# Patient Record
Sex: Female | Born: 2000 | Race: White | Hispanic: No | Marital: Single | Smoking: Current every day smoker
Health system: Southern US, Community
[De-identification: ages and names within clinical notes are randomized; demographics above are authoritative.]

---
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
## 2019-03-10 IMAGING — US US ABDOMEN COMPLETE
1 series · 14 of 25 positions shown · non-contrast
Comparison: None.

CLINICAL DATA: Chronic left upper quadrant pain.

EXAM:
ABDOMEN ULTRASOUND COMPLETE

[Series 1: us abdomen complete · 14 of 114 slices shown]
[im 1/114]
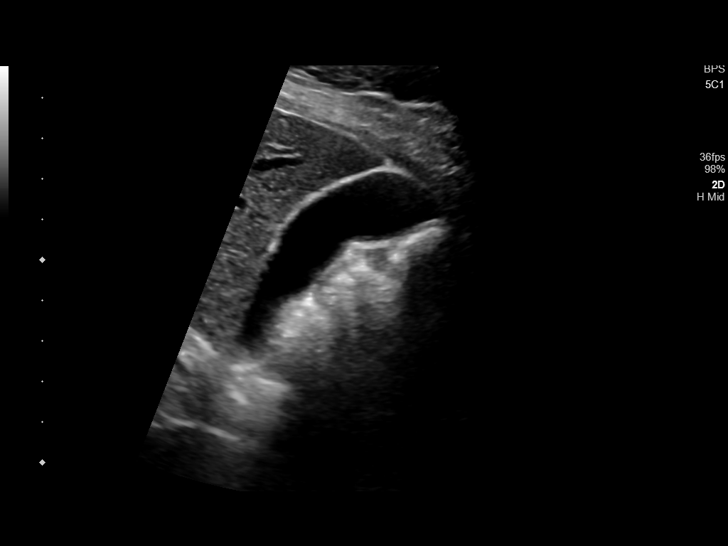
[im 10/114]
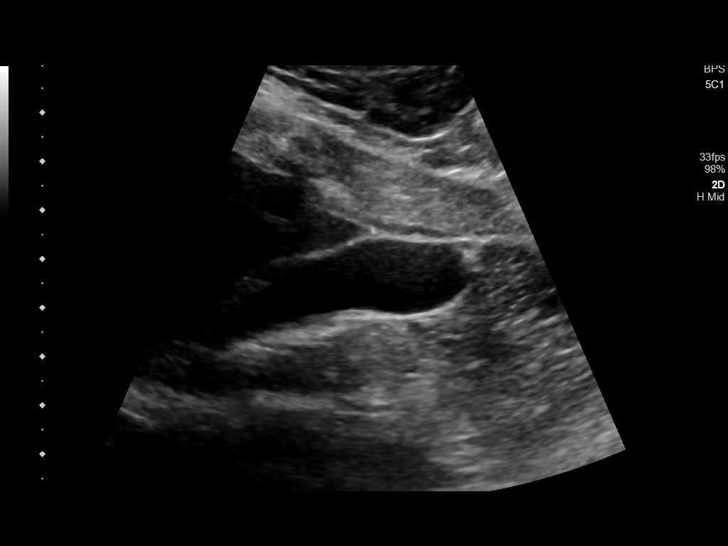
[im 19/114]
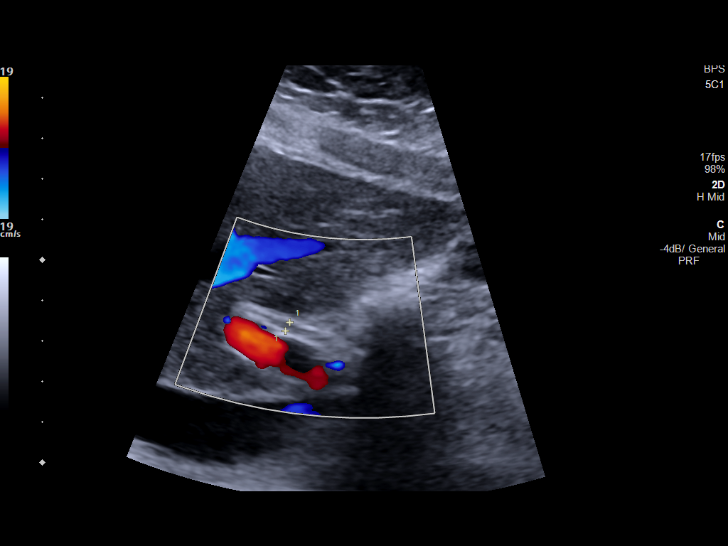
[im 29/114]
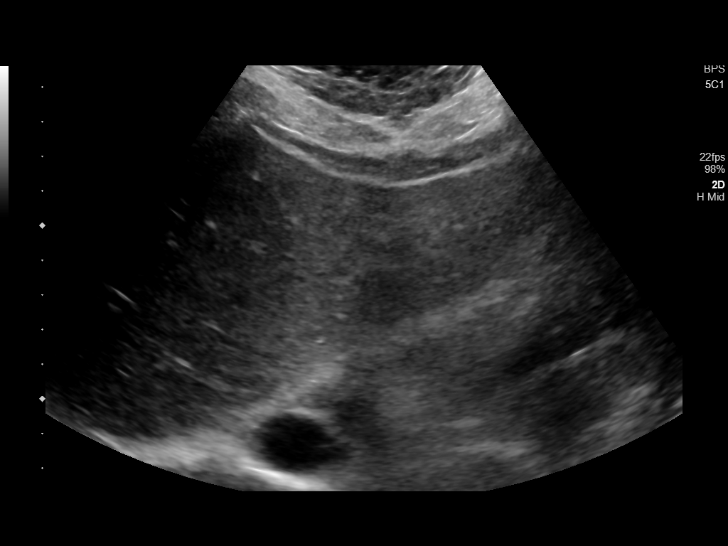
[im 38/114]
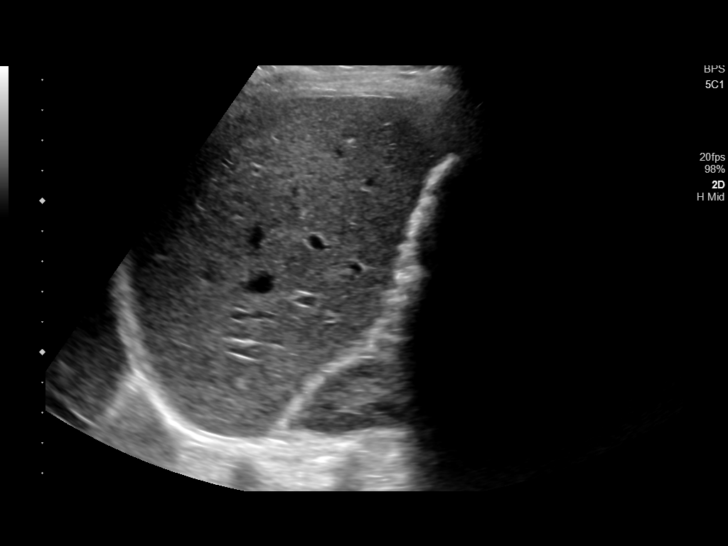
[im 43/114]
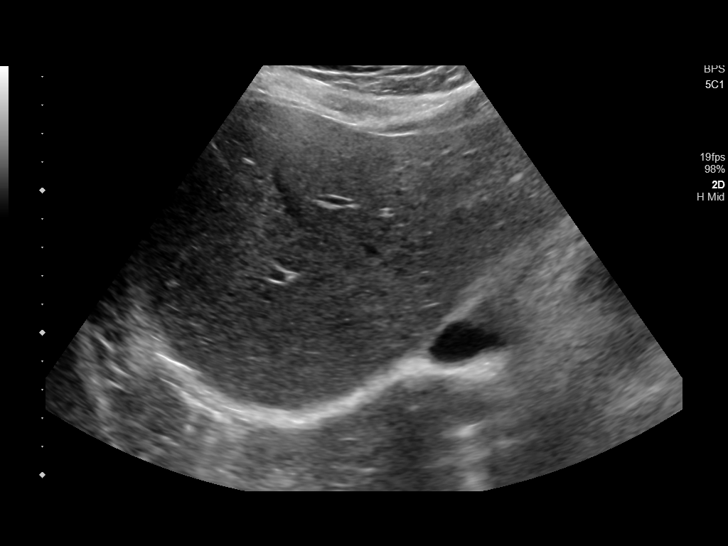
[im 52/114]
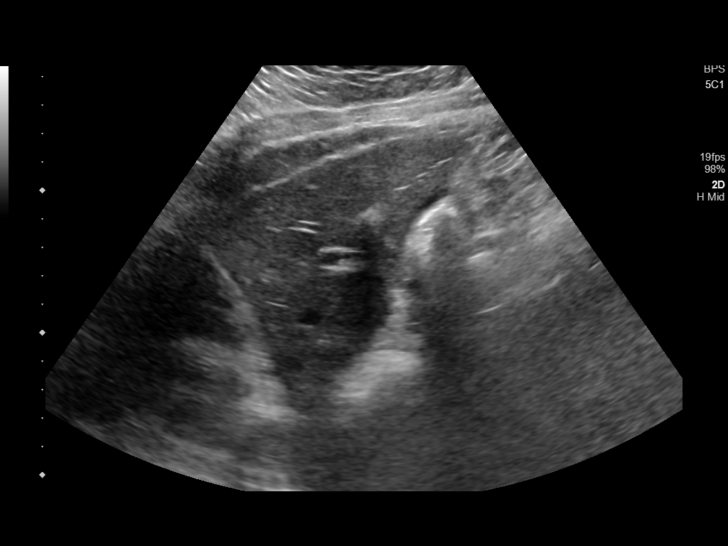
[im 62/114]
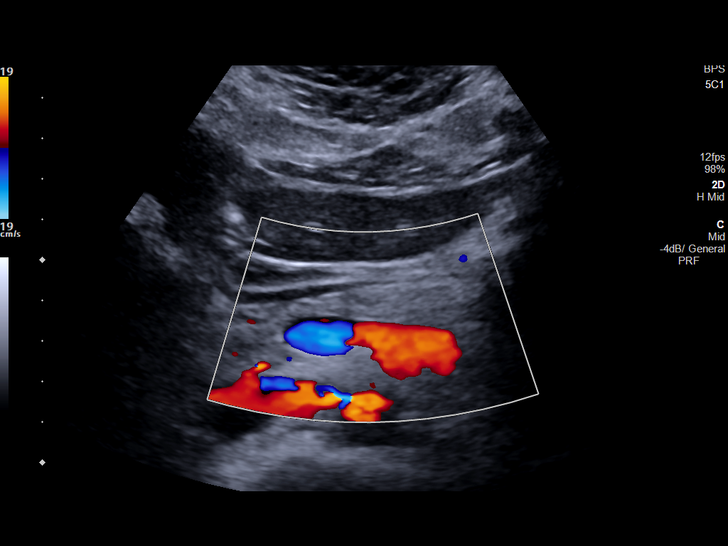
[im 71/114]
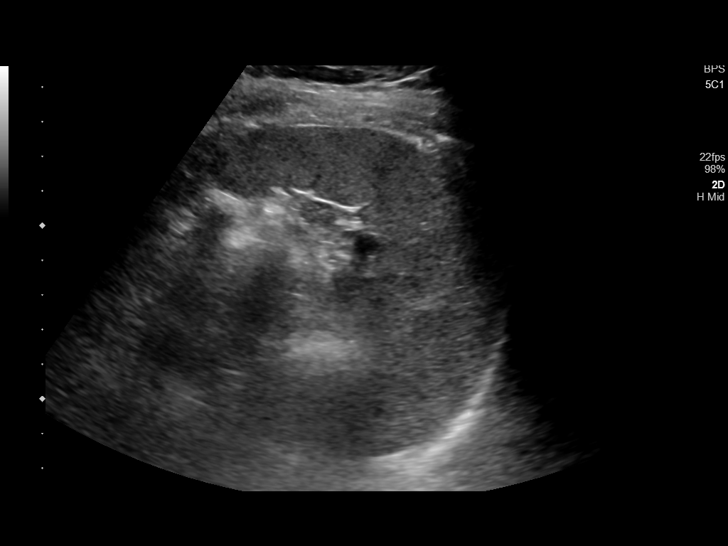
[im 76/114]
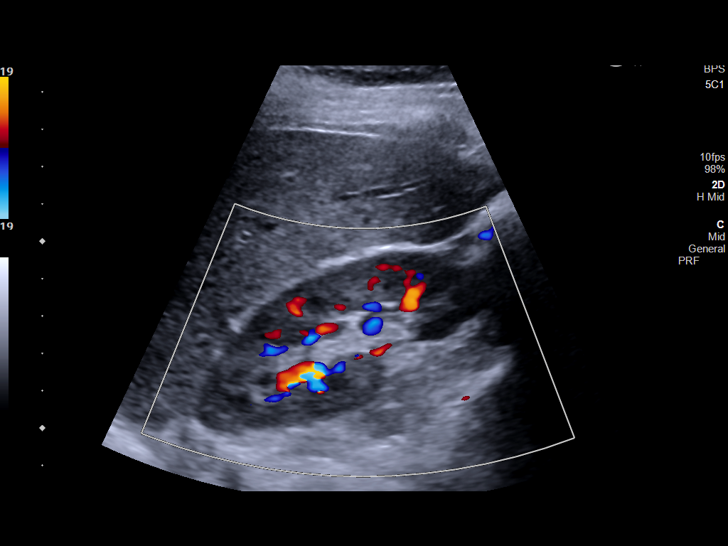
[im 85/114]
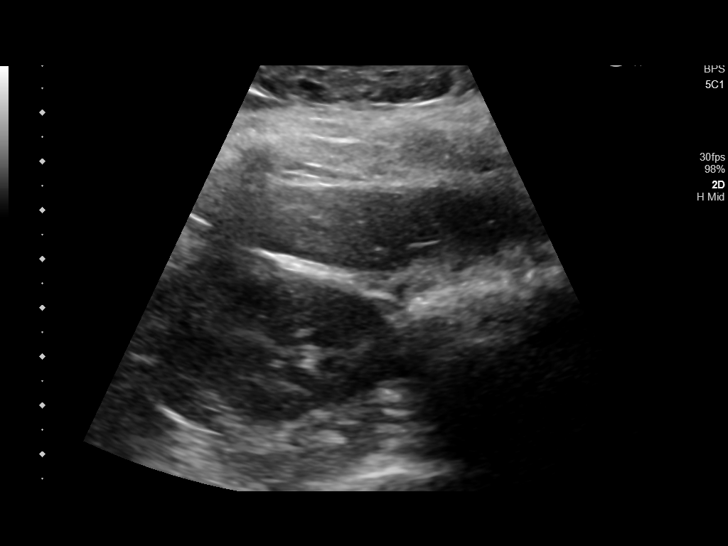
[im 95/114]
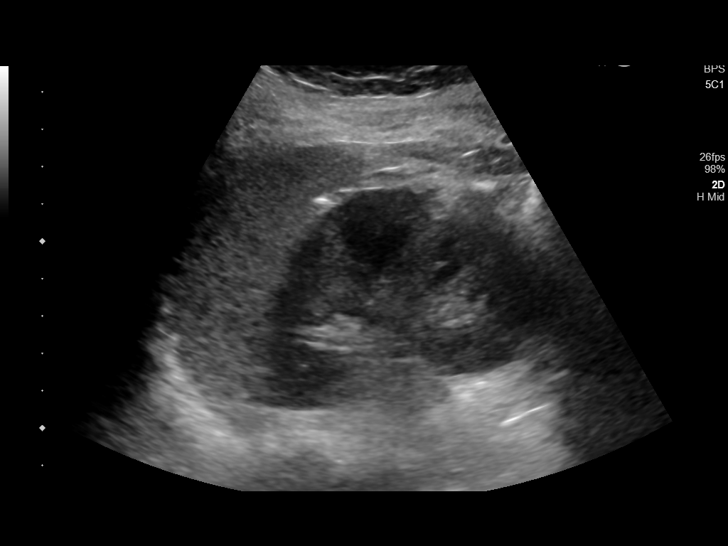
[im 104/114]
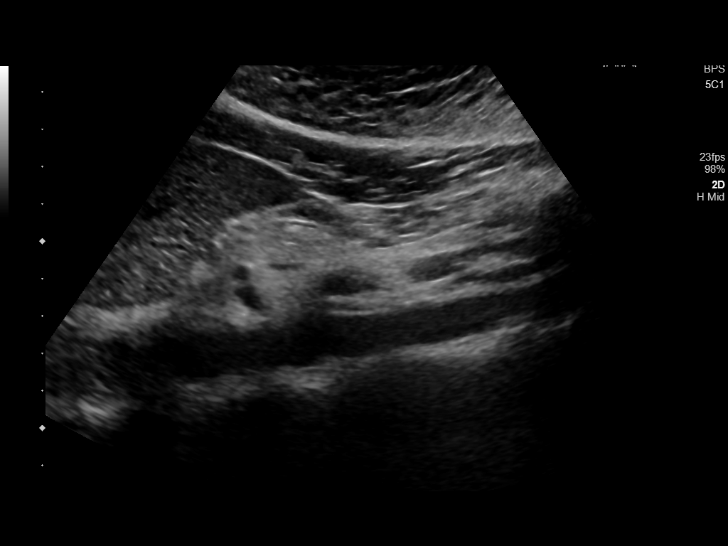
[im 114/114]
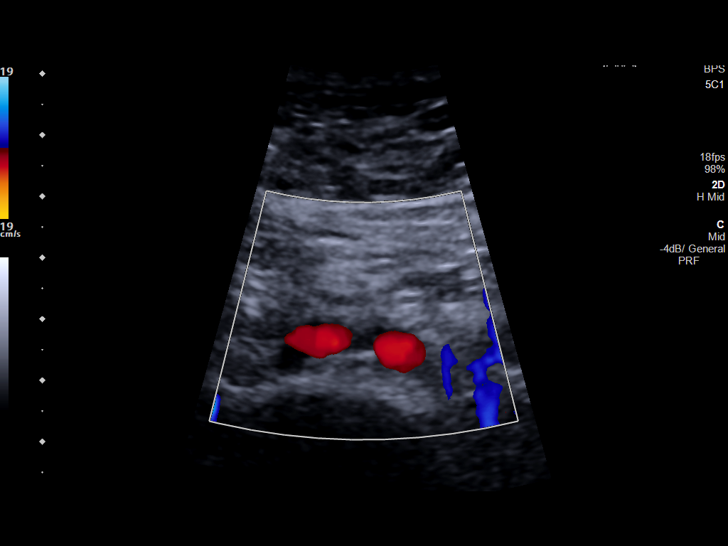

[14 of 25 positions shown; findings below may reference images not displayed]

FINDINGS: Gallbladder: No gallstones or wall thickening visualized. No
sonographic Murphy sign noted by sonographer.

Common bile duct: Diameter: 2 mm, normal.

Liver: No focal lesion identified. Within normal limits in
parenchymal echogenicity. Portal vein is patent on color Doppler
imaging with normal direction of blood flow towards the liver.

IVC: No abnormality visualized.

Pancreas: Visualized portion unremarkable.

Spleen: Size and appearance within normal limits.

Right Kidney: Length: 9.7 cm. Echogenicity within normal limits. No
mass or hydronephrosis visualized.

Left Kidney: Length: 9.1 cm. Echogenicity within normal limits. No
mass or hydronephrosis visualized.

Abdominal aorta: No aneurysm visualized.

Other findings: None.
IMPRESSION: Normal abdominal ultrasound.
